# Patient Record
Sex: Male | Born: 1935 | Race: White | Hispanic: No | Marital: Married | Smoking: Former smoker
Health system: Southern US, Community
[De-identification: ages and names within clinical notes are randomized; demographics above are authoritative.]

## PROBLEM LIST (undated history)

## (undated) DIAGNOSIS — H269 Unspecified cataract: Secondary | ICD-10-CM

## (undated) DIAGNOSIS — I429 Cardiomyopathy, unspecified: Secondary | ICD-10-CM

## (undated) DIAGNOSIS — I4892 Unspecified atrial flutter: Secondary | ICD-10-CM

## (undated) DIAGNOSIS — I890 Lymphedema, not elsewhere classified: Secondary | ICD-10-CM

## (undated) DIAGNOSIS — H9190 Unspecified hearing loss, unspecified ear: Secondary | ICD-10-CM

## (undated) DIAGNOSIS — K635 Polyp of colon: Secondary | ICD-10-CM

## (undated) DIAGNOSIS — F0390 Unspecified dementia without behavioral disturbance: Secondary | ICD-10-CM

## (undated) DIAGNOSIS — N200 Calculus of kidney: Secondary | ICD-10-CM

## (undated) DIAGNOSIS — I4891 Unspecified atrial fibrillation: Secondary | ICD-10-CM

## (undated) DIAGNOSIS — C61 Malignant neoplasm of prostate: Secondary | ICD-10-CM

## (undated) DIAGNOSIS — N281 Cyst of kidney, acquired: Secondary | ICD-10-CM

## (undated) DIAGNOSIS — I951 Orthostatic hypotension: Secondary | ICD-10-CM

## (undated) HISTORY — PX: CHOLECYSTECTOMY: SHX55

## (undated) HISTORY — PX: PROSTATECTOMY: SHX69

---

## 2014-08-17 ENCOUNTER — Other Ambulatory Visit: Payer: Self-pay

## 2014-08-17 ENCOUNTER — Emergency Department (HOSPITAL_BASED_OUTPATIENT_CLINIC_OR_DEPARTMENT_OTHER): Payer: Medicare Other

## 2014-08-17 ENCOUNTER — Emergency Department (HOSPITAL_BASED_OUTPATIENT_CLINIC_OR_DEPARTMENT_OTHER)
Admission: EM | Admit: 2014-08-17 | Discharge: 2014-08-17 | Disposition: A | Discharge status: Home / Self Care | Payer: Medicare Other | Attending: Emergency Medicine | Admitting: Emergency Medicine

## 2014-08-17 ENCOUNTER — Encounter (HOSPITAL_BASED_OUTPATIENT_CLINIC_OR_DEPARTMENT_OTHER): Payer: Self-pay | Admitting: *Deleted

## 2014-08-17 DIAGNOSIS — R42 Dizziness and giddiness: Secondary | ICD-10-CM | POA: Insufficient documentation

## 2014-08-17 DIAGNOSIS — Z8546 Personal history of malignant neoplasm of prostate: Secondary | ICD-10-CM | POA: Insufficient documentation

## 2014-08-17 DIAGNOSIS — Z86010 Personal history of colonic polyps: Secondary | ICD-10-CM | POA: Insufficient documentation

## 2014-08-17 DIAGNOSIS — Z8679 Personal history of other diseases of the circulatory system: Secondary | ICD-10-CM | POA: Diagnosis not present

## 2014-08-17 DIAGNOSIS — Z87891 Personal history of nicotine dependence: Secondary | ICD-10-CM | POA: Diagnosis not present

## 2014-08-17 DIAGNOSIS — H9190 Unspecified hearing loss, unspecified ear: Secondary | ICD-10-CM | POA: Insufficient documentation

## 2014-08-17 DIAGNOSIS — Z79899 Other long term (current) drug therapy: Secondary | ICD-10-CM | POA: Diagnosis not present

## 2014-08-17 DIAGNOSIS — Q6100 Congenital renal cyst, unspecified: Secondary | ICD-10-CM | POA: Insufficient documentation

## 2014-08-17 DIAGNOSIS — Z87442 Personal history of urinary calculi: Secondary | ICD-10-CM | POA: Diagnosis not present

## 2014-08-17 DIAGNOSIS — H269 Unspecified cataract: Secondary | ICD-10-CM | POA: Diagnosis not present

## 2014-08-17 HISTORY — DX: Unspecified atrial flutter: I48.92

## 2014-08-17 HISTORY — DX: Orthostatic hypotension: I95.1

## 2014-08-17 HISTORY — DX: Unspecified hearing loss, unspecified ear: H91.90

## 2014-08-17 HISTORY — DX: Unspecified cataract: H26.9

## 2014-08-17 HISTORY — DX: Calculus of kidney: N20.0

## 2014-08-17 HISTORY — DX: Cardiomyopathy, unspecified: I42.9

## 2014-08-17 HISTORY — DX: Unspecified atrial fibrillation: I48.91

## 2014-08-17 HISTORY — DX: Polyp of colon: K63.5

## 2014-08-17 HISTORY — DX: Lymphedema, not elsewhere classified: I89.0

## 2014-08-17 HISTORY — DX: Cyst of kidney, acquired: N28.1

## 2014-08-17 HISTORY — DX: Malignant neoplasm of prostate: C61

## 2014-08-17 LAB — CBC WITH DIFFERENTIAL/PLATELET
BASOS ABS: 0.1 10*3/uL (ref 0.0–0.1)
BASOS PCT: 1 % (ref 0–1)
EOS ABS: 0.2 10*3/uL (ref 0.0–0.7)
EOS PCT: 2 % (ref 0–5)
HEMATOCRIT: 40.5 % (ref 39.0–52.0)
Hemoglobin: 13.9 g/dL (ref 13.0–17.0)
Lymphocytes Relative: 25 % (ref 12–46)
Lymphs Abs: 2.4 10*3/uL (ref 0.7–4.0)
MCH: 31.7 pg (ref 26.0–34.0)
MCHC: 34.3 g/dL (ref 30.0–36.0)
MCV: 92.5 fL (ref 78.0–100.0)
Monocytes Absolute: 1.1 10*3/uL — ABNORMAL HIGH (ref 0.1–1.0)
Monocytes Relative: 12 % (ref 3–12)
NEUTROS ABS: 5.9 10*3/uL (ref 1.7–7.7)
NEUTROS PCT: 60 % (ref 43–77)
PLATELETS: 303 10*3/uL (ref 150–400)
RBC: 4.38 MIL/uL (ref 4.22–5.81)
RDW: 13.7 % (ref 11.5–15.5)
WBC: 9.6 10*3/uL (ref 4.0–10.5)

## 2014-08-17 LAB — URINALYSIS, ROUTINE W REFLEX MICROSCOPIC
Bilirubin Urine: NEGATIVE
Glucose, UA: NEGATIVE mg/dL
HGB URINE DIPSTICK: NEGATIVE
Ketones, ur: NEGATIVE mg/dL
LEUKOCYTES UA: NEGATIVE
NITRITE: NEGATIVE
Protein, ur: NEGATIVE mg/dL
SPECIFIC GRAVITY, URINE: 1.006 (ref 1.005–1.030)
Urobilinogen, UA: 0.2 mg/dL (ref 0.0–1.0)
pH: 6.5 (ref 5.0–8.0)

## 2014-08-17 LAB — BASIC METABOLIC PANEL
ANION GAP: 3 — AB (ref 5–15)
BUN: 13 mg/dL (ref 6–20)
CALCIUM: 8.1 mg/dL — AB (ref 8.9–10.3)
CO2: 25 mmol/L (ref 22–32)
Chloride: 103 mmol/L (ref 101–111)
Creatinine, Ser: 0.9 mg/dL (ref 0.61–1.24)
GFR calc non Af Amer: >60 mL/min (ref >60)
GLUCOSE: 117 mg/dL — AB (ref 70–99)
Potassium: 3.8 mmol/L (ref 3.5–5.1)
Sodium: 131 mmol/L — ABNORMAL LOW (ref 135–145)

## 2014-08-17 LAB — TROPONIN I

## 2014-08-17 NOTE — ED Notes (Signed)
D/c home with spouse- verbalizes understanding to f/u with cardiologist as soon as possible this week

## 2014-08-17 NOTE — ED Provider Notes (Signed)
CSN: [id number]     Arrival date & time 08/17/14  1022 History   First MD Initiated Contact with Patient 08/17/14 1036     Chief Complaint  Patient presents with  . Dizziness     (Consider location/radiation/quality/duration/timing/severity/associated sxs/prior Treatment) HPI  David Shepard is a 81 y.o. male with PMH of age or fibrillation, orthostatic hypotension presenting with 2 weeks of dizziness described as lightheadedness worse with standing. Patient states he's had acute worsening in the last 3-4 days. Patient presented to his cardiologist 2 weeks ago for similar symptoms and they prescribed amiodarone. Patient is no longer taking warfarin but had 4 months ago. Patient also with complaint of intermittent headaches over the past week described as an ache that developed gradually. Bilateral and frontal .They're not associated with visual changes, slurred speech, weakness. No fevers or chills. No nausea or vomiting. Patient denies chest pain or shortness of breath. Patient ambulates with a cane. He states he was evaluated by neurology over a year ago. They're unsure of the workup.   Past Medical History  Diagnosis Date  . Atrial fibrillation   . Atrial flutter   . Prostate cancer   . Cardiomyopathy   . Renal cyst   . Cataract   . Hearing loss   . Orthostatic hypotension   . Lymphedema   . Colon polyp   . Kidney stone    Past Surgical History  Procedure Laterality Date  . Prostatectomy    . Cholecystectomy     No family history on file. History  Substance Use Topics  . Smoking status: Former Research scientist (life sciences)  . Smokeless tobacco: Never Used  . Alcohol Use: No    Review of Systems 10 Systems reviewed and are negative for acute change except as noted in the HPI.    Allergies  Aspirin and Clindamycin/lincomycin  Home Medications   Prior to Admission medications   Medication Sig Start Date End Date Taking? Authorizing Provider  amiodarone (PACERONE) 200 MG tablet Take 200 mg  by mouth daily.   Yes Historical Provider, MD  Apoaequorin (PREVAGEN PO) Take by mouth.   Yes Historical Provider, MD  ascorbic acid (VITAMIN C) 1000 MG tablet Take 1,000 mg by mouth daily.   Yes Historical Provider, MD  Biotin 1 MG CAPS Take 1 tablet by mouth daily.   Yes Historical Provider, MD  calcium carbonate (OS-CAL) 600 MG TABS tablet Take 600 mg by mouth daily.   Yes Historical Provider, MD  carvedilol (COREG) 25 MG tablet Take 25 mg by mouth 2 (two) times daily with a meal.   Yes Historical Provider, MD  folic acid (FOLVITE) 254 MCG tablet Take 400 mcg by mouth daily.   Yes Historical Provider, MD  Magnesium Oxide 250 MG TABS Take 1 tablet by mouth daily.   Yes Historical Provider, MD  Multiple Vitamin (MULTIVITAMIN) capsule Take 1 capsule by mouth daily.   Yes Historical Provider, MD  Omega-3 Fatty Acids (FISH OIL PO) Take 1 capsule by mouth daily.   Yes Historical Provider, MD  SELENIUM PO Take by mouth.   Yes Historical Provider, MD  zinc gluconate 50 MG tablet Take 50 mg by mouth daily.   Yes Historical Provider, MD   BP 151/79 mmHg  Pulse 38  Temp(Src) 97.3 F (36.3 C) (Oral)  Resp 24  Ht 6\' 2"  (1.88 m)  Wt 195 lb (88.451 kg)  BMI 25.03 kg/m2  SpO2 94% Physical Exam  Constitutional: He is oriented to person, place, and time.  He appears well-developed and well-nourished. No distress.  HENT:  Head: Normocephalic and atraumatic.  Mouth/Throat: Oropharynx is clear and moist.  Eyes: Conjunctivae and EOM are normal. Pupils are equal, round, and reactive to light. Right eye exhibits no discharge. Left eye exhibits no discharge.  Neck: Normal range of motion. Neck supple.  No nuchal rigidity  Cardiovascular: Normal rate and regular rhythm.   Pulmonary/Chest: Effort normal and breath sounds normal. No respiratory distress. He has no wheezes.  Abdominal: Soft. Bowel sounds are normal. He exhibits no distension. There is no tenderness.  Neurological: He is alert and oriented to  person, place, and time. No cranial nerve deficit. Coordination normal.  Speech is clear and goal oriented.  Strength 5/5 in upper and lower extremities. Sensation intact. Intact rapid alternating movements, finger to nose, and heel to shin. No pronator drift. Steady gait with cane.  Skin: Skin is warm and dry. He is not diaphoretic.  Nursing note and vitals reviewed.   ED Course  Procedures (including critical care time) Labs Review Labs Reviewed  CBC WITH DIFFERENTIAL/PLATELET - Abnormal; Notable for the following:    Monocytes Absolute 1.1 (*)    All other components within normal limits  BASIC METABOLIC PANEL - Abnormal; Notable for the following:    Sodium 131 (*)    Glucose, Bld 117 (*)    Calcium 8.1 (*)    Anion gap 3 (*)    All other components within normal limits  URINALYSIS, ROUTINE W REFLEX MICROSCOPIC  TROPONIN I    Imaging Review Dg Chest 2 View  08/17/2014   CLINICAL DATA:  81 year old male with dizziness for 2 weeks. Initial encounter.  EXAM: CHEST  2 VIEW  COMPARISON:  None.  FINDINGS: Cardiomegaly. Possible small pleural effusions. No pneumothorax. No pulmonary edema. No other confluent pulmonary opacity. Flowing osteophytes in the thoracic spine. Cholecystectomy clips. Visualized tracheal air column is within normal limits.  IMPRESSION: Cardiomegaly and possible small pleural effusions. No other acute cardiopulmonary abnormality.   Electronically Signed   By: Genevie Ann M.D.   On: 08/17/2014 11:15   Ct Head Wo Contrast  08/17/2014   CLINICAL DATA:  Two week history of dizziness and balance disorder  EXAM: CT HEAD WITHOUT CONTRAST  TECHNIQUE: Contiguous axial images were obtained from the base of the skull through the vertex without intravenous contrast.  COMPARISON:  None.  FINDINGS: There is mild diffuse atrophy. There is no intracranial mass, hemorrhage, extra-axial fluid collection, or midline shift. There is mild patchy small vessel disease in the centra semiovale  bilaterally. No acute infarct apparent. The bony calvarium appears intact. The mastoid air cells are clear.  IMPRESSION: Mild atrophy with mild periventricular small vessel disease. No intracranial mass, hemorrhage, or acute appearing infarct.   Electronically Signed   By: Lowella Grip III M.D.   On: 08/17/2014 11:19     EKG Interpretation   Date/Time:  Monday Aug 17 2014 10:38:03 EDT Ventricular Rate:  95 PR Interval:    QRS Duration: 166 QT Interval:  418 QTC Calculation: 525 R Axis:   -83 Text Interpretation:  Atrial fibrillation Left axis deviation Left bundle  branch block Abnormal ECG Confirmed by ZAVITZ  MD, JOSHUA ([id number]) on  08/17/2014 10:44:27 AM      MDM   Final diagnoses:  Lightheadedness   Patient presenting with dizziness described as lightheadedness for 2 weeks with recent addition of amiodarone. VSS. Rate controlled and patient in atrial fibrillation. He denies chest pain. Neurological exam  without deficits. Patient ambulatory with steady gait with cane. Laboratory workup reassuring. Head CT without acute abnormalities. Chest x-ray without pneumonia. Urine without evidence of infection. EKG with atrial fibrillation and left bundle branch block without prior to compare to. Negative troponin. No reported chest pain or shortness of breath. Patient is orthostatic. Patient's lightheadedness could be due to orthostatic hypotension which he has a history of or amiodarone. Patient to follow closely with cardiologist for possible medication alteration. Pt nontoxic nonseptic appearing and stable for outpatient management. Referral to neurology as well.  Discussed return precautions with patient. Discussed all results and patient verbalizes understanding and agrees with plan.  This is a shared patient. This patient was discussed with the physician who saw and evaluated the patient and agrees with the plan.     Al Corpus, PA-C 08/17/14 [place]  Elnora Morrison, MD 08/19/14  [phone]

## 2014-08-17 NOTE — ED Notes (Signed)
Dizzy x 2 weeks with recent change inmed for afib- states dizziness worse x 3-4 days- c/o headache- denies chest pain

## 2014-08-17 NOTE — ED Notes (Signed)
Patient transported to CT 

## 2014-08-17 NOTE — Discharge Instructions (Signed)
Return to the emergency room with worsening of symptoms, new symptoms or with symptoms that are concerning , especially severe worsening of headache, visual or speech changes, weakness in face, arms or legs. Please call your doctor/cardiologist for a followup appointment within 24-48 hours. When you talk to your doctor please let them know that you were seen in the emergency department and have them acquire all of your records so that they can discuss the findings with you and formulate a treatment plan to fully care for your new and ongoing problems.  Read below information and follow recommendations.  Dizziness Dizziness is a common problem. It is a feeling of unsteadiness or light-headedness. You may feel like you are about to faint. Dizziness can lead to injury if you stumble or fall. A person of any age group can suffer from dizziness, but dizziness is more common in older adults. CAUSES  Dizziness can be caused by many different things, including:  Middle ear problems.  Standing for too long.  Infections.  An allergic reaction.  Aging.  An emotional response to something, such as the sight of blood.  Side effects of medicines.  Tiredness.  Problems with circulation or blood pressure.  Excessive use of alcohol or medicines, or illegal drug use.  Breathing too fast (hyperventilation).  An irregular heart rhythm (arrhythmia).  A low red blood cell count (anemia).  Pregnancy.  Vomiting, diarrhea, fever, or other illnesses that cause body fluid loss (dehydration).  Diseases or conditions such as Parkinson's disease, high blood pressure (hypertension), diabetes, and thyroid problems.  Exposure to extreme heat. DIAGNOSIS  Your health care provider will ask about your symptoms, perform a physical exam, and perform an electrocardiogram (ECG) to record the electrical activity of your heart. Your health care provider may also perform other heart or blood tests to determine the  cause of your dizziness. These may include:  Transthoracic echocardiogram (TTE). During echocardiography, sound waves are used to evaluate how blood flows through your heart.  Transesophageal echocardiogram (TEE).  Cardiac monitoring. This allows your health care provider to monitor your heart rate and rhythm in real time.  Holter monitor. This is a portable device that records your heartbeat and can help diagnose heart arrhythmias. It allows your health care provider to track your heart activity for several days if needed.  Stress tests by exercise or by giving medicine that makes the heart beat faster. TREATMENT  Treatment of dizziness depends on the cause of your symptoms and can vary greatly. HOME CARE INSTRUCTIONS   Drink enough fluids to keep your urine clear or pale yellow. This is especially important in very hot weather. In older adults, it is also important in cold weather.  Take your medicine exactly as directed if your dizziness is caused by medicines. When taking blood pressure medicines, it is especially important to get up slowly.  Rise slowly from chairs and steady yourself until you feel okay.  In the morning, first sit up on the side of the bed. When you feel okay, stand slowly while holding onto something until you know your balance is fine.  Move your legs often if you need to stand in one place for a long time. Tighten and relax your muscles in your legs while standing.  Have someone stay with you for 1-2 days if dizziness continues to be a problem. Do this until you feel you are well enough to stay alone. Have the person call your health care provider if he or she notices  changes in you that are concerning.  Do not drive or use heavy machinery if you feel dizzy.  Do not drink alcohol. SEEK IMMEDIATE MEDICAL CARE IF:   Your dizziness or light-headedness gets worse.  You feel nauseous or vomit.  You have problems talking, walking, or using your arms, hands, or  legs.  You feel weak.  You are not thinking clearly or you have trouble forming sentences. It may take a friend or family member to notice this.  You have chest pain, abdominal pain, shortness of breath, or sweating.  Your vision changes.  You notice any bleeding.  You have side effects from medicine that seems to be getting worse rather than better. MAKE SURE YOU:   Understand these instructions.  Will watch your condition.  Will get help right away if you are not doing well or get worse. Document Released: 09/27/2000 Document Revised: 04/08/2013 Document Reviewed: 10/21/2010 [facility] Patient Information 2015 [place], [place]. This information is not intended to replace advice given to you by your health care provider. Make sure you discuss any questions you have with your health care provider.   EXAM: CT HEAD WITHOUT CONTRAST  TECHNIQUE: Contiguous axial images were obtained from the base of the skull through the vertex without intravenous contrast.  COMPARISON: None.  FINDINGS: There is mild diffuse atrophy. There is no intracranial mass, hemorrhage, extra-axial fluid collection, or midline shift. There is mild patchy small vessel disease in the centra semiovale bilaterally. No acute infarct apparent. The bony calvarium appears intact. The mastoid air cells are clear.  IMPRESSION: Mild atrophy with mild periventricular small vessel disease. No intracranial mass, hemorrhage, or acute appearing infarct.   Electronically Signed By: Lowella Grip III M.D. On: 08/17/2014 11:19          DG Chest 2 View (Final result) Result time: 08/17/14 11:15:45   Final result by Rad Results In Interface (08/17/14 11:15:45)   Narrative:   CLINICAL DATA: 81 year old male with dizziness for 2 weeks. Initial encounter.  EXAM: CHEST 2 VIEW  COMPARISON: None.  FINDINGS: Cardiomegaly. Possible small pleural effusions. No pneumothorax. No pulmonary edema. No  other confluent pulmonary opacity. Flowing osteophytes in the thoracic spine. Cholecystectomy clips. Visualized tracheal air column is within normal limits.  IMPRESSION: Cardiomegaly and possible small pleural effusions. No other acute cardiopulmonary abnormality.   Negative Troponin I 08/17/2014

## 2014-08-17 NOTE — ED Notes (Signed)
Patient transported to X-ray 

## 2018-12-08 ENCOUNTER — Encounter (HOSPITAL_BASED_OUTPATIENT_CLINIC_OR_DEPARTMENT_OTHER): Payer: Self-pay | Admitting: Emergency Medicine

## 2018-12-08 ENCOUNTER — Emergency Department (HOSPITAL_BASED_OUTPATIENT_CLINIC_OR_DEPARTMENT_OTHER)
Admission: EM | Admit: 2018-12-08 | Discharge: 2018-12-08 | Disposition: A | Discharge status: Home / Self Care | Payer: Medicare Other | Attending: Emergency Medicine | Admitting: Emergency Medicine

## 2018-12-08 ENCOUNTER — Other Ambulatory Visit: Payer: Self-pay

## 2018-12-08 ENCOUNTER — Emergency Department (HOSPITAL_BASED_OUTPATIENT_CLINIC_OR_DEPARTMENT_OTHER): Payer: Medicare Other

## 2018-12-08 DIAGNOSIS — M546 Pain in thoracic spine: Secondary | ICD-10-CM | POA: Insufficient documentation

## 2018-12-08 DIAGNOSIS — W19XXXA Unspecified fall, initial encounter: Secondary | ICD-10-CM

## 2018-12-08 DIAGNOSIS — W010XXA Fall on same level from slipping, tripping and stumbling without subsequent striking against object, initial encounter: Secondary | ICD-10-CM | POA: Insufficient documentation

## 2018-12-08 DIAGNOSIS — Z7901 Long term (current) use of anticoagulants: Secondary | ICD-10-CM | POA: Diagnosis not present

## 2018-12-08 DIAGNOSIS — Z87891 Personal history of nicotine dependence: Secondary | ICD-10-CM | POA: Diagnosis not present

## 2018-12-08 DIAGNOSIS — Z79899 Other long term (current) drug therapy: Secondary | ICD-10-CM | POA: Diagnosis not present

## 2018-12-08 DIAGNOSIS — Z8546 Personal history of malignant neoplasm of prostate: Secondary | ICD-10-CM | POA: Insufficient documentation

## 2018-12-08 LAB — CBC WITH DIFFERENTIAL/PLATELET
Abs Immature Granulocytes: 0.04 10*3/uL (ref 0.00–0.07)
Basophils Absolute: 0.1 10*3/uL (ref 0.0–0.1)
Basophils Relative: 1 %
Eosinophils Absolute: 0.1 10*3/uL (ref 0.0–0.5)
Eosinophils Relative: 1 %
HCT: 40.2 % (ref 39.0–52.0)
Hemoglobin: 13 g/dL (ref 13.0–17.0)
Immature Granulocytes: 0 %
Lymphocytes Relative: 49 %
Lymphs Abs: 7.5 10*3/uL — ABNORMAL HIGH (ref 0.7–4.0)
MCH: 31.6 pg (ref 26.0–34.0)
MCHC: 32.3 g/dL (ref 30.0–36.0)
MCV: 97.8 fL (ref 80.0–100.0)
Monocytes Absolute: 1.8 10*3/uL — ABNORMAL HIGH (ref 0.1–1.0)
Monocytes Relative: 12 %
Neutro Abs: 5.7 10*3/uL (ref 1.7–7.7)
Neutrophils Relative %: 37 %
Platelets: 205 10*3/uL (ref 150–400)
RBC: 4.11 MIL/uL — ABNORMAL LOW (ref 4.22–5.81)
RDW: 13.6 % (ref 11.5–15.5)
WBC: 15.2 10*3/uL — ABNORMAL HIGH (ref 4.0–10.5)
nRBC: 0 % (ref 0.0–0.2)

## 2018-12-08 MED ORDER — TRAMADOL HCL 50 MG PO TABS: 50.0000 mg | ORAL_TABLET | Freq: Four times a day (QID) | ORAL | 0 refills | Status: AC | PRN

## 2018-12-08 NOTE — ED Provider Notes (Signed)
[facility] DEPARTMENT Provider Note   CSN: [id number] Arrival date & time: 12/08/18  1727     History   Chief Complaint Chief Complaint  Patient presents with   Fall    HPI David Shepard is a 85 y.o. male.     Patient is a DNR.  Patient brought in by EMS.  Patient was moving furniture when he had a fall.  Patient with complaint of pain to the thoracic back area between the shoulder blades.  No loss of consciousness states he did not hit his head.  Patient is on Eliquis.  No obvious extremity or lower extremity pain.  No low back pain.     Past Medical History:  Diagnosis Date   Atrial fibrillation ([place])    Atrial flutter ([place])    Cardiomyopathy ([facility])    Cataract    Colon polyp    Hearing loss    Kidney stone    Lymphedema    Orthostatic hypotension    Prostate cancer ([place])    Renal cyst     There are no active problems to display for this patient.   Past Surgical History:  Procedure Laterality Date   CHOLECYSTECTOMY     PROSTATECTOMY          Home Medications    Prior to Admission medications   Medication Sig Start Date End Date Taking? Authorizing Provider  apixaban (ELIQUIS) 5 MG TABS tablet Take 5 mg by mouth 2 (two) times daily.   Yes [provider]  amiodarone (PACERONE) 200 MG tablet Take 200 mg by mouth daily.    [provider]  Apoaequorin (PREVAGEN PO) Take by mouth.    [provider]  ascorbic acid (VITAMIN C) 1000 MG tablet Take 1,000 mg by mouth daily.    [provider]  Biotin 1 MG CAPS Take 1 tablet by mouth daily.    [provider]  calcium carbonate (OS-CAL) 600 MG TABS tablet Take 600 mg by mouth daily.    [provider]  carvedilol (COREG) 25 MG tablet Take 25 mg by mouth 2 (two) times daily with a meal.    [provider]  folic acid (FOLVITE) Q000111Q MCG tablet Take 400 mcg by mouth daily.    [provider]  Magnesium Oxide  250 MG TABS Take 1 tablet by mouth daily.    [provider]  Multiple Vitamin (MULTIVITAMIN) capsule Take 1 capsule by mouth daily.    [provider]  Omega-3 Fatty Acids (FISH OIL PO) Take 1 capsule by mouth daily.    [provider]  SELENIUM PO Take by mouth.    [provider]  traMADol (ULTRAM) 50 MG tablet Take 1 tablet (50 mg total) by mouth every 6 (six) hours as needed for moderate pain. 12/08/18   Fredia Sorrow, MD  zinc gluconate 50 MG tablet Take 50 mg by mouth daily.    [provider]    Family History No family history on file.  Social History Social History   Tobacco Use   Smoking status: Former Smoker   Smokeless tobacco: Never Used  Substance Use Topics   Alcohol use: No   Drug use: No     Allergies   Aspirin and Clindamycin/lincomycin   Review of Systems Review of Systems  Constitutional: Negative for chills and fever.  HENT: Negative for congestion, rhinorrhea and sore throat.   Eyes: Negative for visual disturbance.  Respiratory: Negative for cough and  shortness of breath.   Cardiovascular: Negative for chest pain and leg swelling.  Gastrointestinal: Negative for abdominal pain, diarrhea, nausea and vomiting.  Genitourinary: Negative for dysuria.  Musculoskeletal: Positive for back pain. Negative for neck pain.  Skin: Negative for rash.  Neurological: Negative for dizziness, light-headedness and headaches.  Hematological: Bruises/bleeds easily.  Psychiatric/Behavioral: Negative for confusion.     Physical Exam Updated Vital Signs BP (!) 153/85 (BP Location: Right Arm)    Pulse 62    Temp 98.3 F (36.8 C) (Oral)    Resp 18    Ht 1.88 m (6\' 2" )    Wt 81.6 kg    SpO2 100%    BMI 23.11 kg/m   Physical Exam Vitals signs and nursing note reviewed.  Constitutional:      Appearance: Normal appearance. He is well-developed.  HENT:     Head: Normocephalic and atraumatic.  Eyes:     Extraocular  Movements: Extraocular movements intact.     Conjunctiva/sclera: Conjunctivae normal.     Pupils: Pupils are equal, round, and reactive to light.  Neck:     Musculoskeletal: Normal range of motion and neck supple.  Cardiovascular:     Rate and Rhythm: Normal rate and regular rhythm.     Heart sounds: No murmur.  Pulmonary:     Effort: Pulmonary effort is normal. No respiratory distress.     Breath sounds: Normal breath sounds.  Abdominal:     Palpations: Abdomen is soft.     Tenderness: There is no abdominal tenderness.  Musculoskeletal: Normal range of motion.        General: Signs of injury present. No swelling or deformity.     Comments: Tenderness to palpation to thoracic spine area around T6-T8.  No obvious marks.  No bruising.  Lumbar spine nontender to palpation.  Skin:    General: Skin is warm and dry.  Neurological:     General: No focal deficit present.     Mental Status: He is alert. Mental status is at baseline.     Cranial Nerves: No cranial nerve deficit.      ED Treatments / Results  Labs (all labs ordered are listed, but only abnormal results are displayed) Labs Reviewed  CBC WITH DIFFERENTIAL/PLATELET - Abnormal; Notable for the following components:      Result Value   WBC 15.2 (*)    RBC 4.11 (*)    Lymphs Abs 7.5 (*)    Monocytes Absolute 1.8 (*)    All other components within normal limits    EKG None  Radiology Ct Head Wo Contrast  Result Date: 12/08/2018 CLINICAL DATA:  85 year old male status post fall. Pain. EXAM: CT HEAD WITHOUT CONTRAST CT CERVICAL SPINE WITHOUT CONTRAST CT THORACIC SPINE WITHOUT CONTRAST TECHNIQUE: Multidetector CT imaging of the head, cervical and thoracic spine was performed following the standard protocol without intravenous contrast. Multiplanar CT image reconstructions of the cervical and thoracic spine were also generated. COMPARISON:  Head CT without contrast 08/17/2014 FINDINGS: CT HEAD FINDINGS Brain: Cerebral volume  has not significantly changed and remains normal for age. No midline shift, ventriculomegaly, mass effect, evidence of mass lesion, intracranial hemorrhage or evidence of cortically based acute infarction. Mild for age bilateral white matter hypodensity. Vascular: Calcified atherosclerosis at the skull base. No suspicious intracranial vascular hyperdensity. Skull: Stable and intact. Sinuses/Orbits: Visualized paranasal sinuses and mastoids are stable and well pneumatized. Other: No acute orbit or scalp soft tissue finding. CT CERVICAL SPINE FINDINGS Alignment: Preserved cervical  lordosis. Cervicothoracic junction alignment is within normal limits. Bilateral posterior element alignment is within normal limits. Skull base and vertebrae: Visualized skull base is intact. No atlanto-occipital dissociation. Several cervical spine levels appear functionally ankylosed, including C1-C2 (sagittal image 35), C2-C3 (via the right posterior elements), and C5 through C7 (via bulky flowing anterior osteophytes. No superimposed No acute osseous abnormality identified. Soft tissues and spinal canal: No prevertebral fluid or swelling. No visible canal hematoma. There is generalized bilateral cervical lymphadenopathy in the form of abnormally increased and rounded mostly subcentimeter lymph nodes at all visible nodal stations. The largest nodes are 8-11 millimeters short axis. Disc levels: Evidence of Diffuse idiopathic skeletal hyperostosis (DISH). With multilevel ankylosis as stated above. Multilevel degenerative cervical spinal stenosis suspected, likely most pronounced at C3-C4 and C4-C5. CT THORACIC SPINE FINDINGS Segmentation: Appears normal. Alignment: Preserved thoracic kyphosis. No spondylolisthesis. Vertebrae: Flowing osteophytes throughout the thoracic spine similar to that in the cervical spine resulting in intermittent interbody ankylosis. Little to no posterior element ankylosis is associated. No superimposed No acute  osseous abnormality identified. No acute fracture of the visible ribs. Osteopenia. Soft tissues and spinal canal: Left chest pacemaker type device. Mild superior mediastinal lymphadenopathy in association with the neck findings above. Mild cardiomegaly. No pericardial effusion. Vascular patency is not evaluated in the absence of IV contrast. Calcified aortic atherosclerosis. Right axillary lymphadenopathy (series 15, image 49) is evident. Surgically absent gallbladder with grossly negative other visible noncontrast upper abdominal viscera. Major airways are patent. There is respiratory motion and mild dependent pulmonary atelectasis. A few scattered calcified granulomas are suspected. Otherwise negative visible lungs. Posterior paraspinal soft tissues appear within normal limits. Disc levels: Mild for age thoracic spine degeneration aside from the flowing osteophytes stated earlier. IMPRESSION: 1. Generalized lymphadenopathy in the bilateral neck and some of the upper chest. The nodal appearance most resembles Leukemia / Lymphoma. Recommend further evaluation. 2. No acute traumatic injury identified in the head, cervical spine, or thoracic spine. 3. Stable non contrast CT appearance of the brain since 2016. 4. Intermittent cervical and thoracic spine ankylosis due to diffuse idiopathic skeletal hyperostosis (DISH). 5. Multilevel degenerative cervical spinal stenosis suspected. 6. Mild cardiomegaly. Electronically Signed   By: Genevie Ann M.D.   On: 12/08/2018 18:40   Ct Cervical Spine Wo Contrast  Result Date: 12/08/2018 CLINICAL DATA:  85 year old male status post fall. Pain. EXAM: CT HEAD WITHOUT CONTRAST CT CERVICAL SPINE WITHOUT CONTRAST CT THORACIC SPINE WITHOUT CONTRAST TECHNIQUE: Multidetector CT imaging of the head, cervical and thoracic spine was performed following the standard protocol without intravenous contrast. Multiplanar CT image reconstructions of the cervical and thoracic spine were also  generated. COMPARISON:  Head CT without contrast 08/17/2014 FINDINGS: CT HEAD FINDINGS Brain: Cerebral volume has not significantly changed and remains normal for age. No midline shift, ventriculomegaly, mass effect, evidence of mass lesion, intracranial hemorrhage or evidence of cortically based acute infarction. Mild for age bilateral white matter hypodensity. Vascular: Calcified atherosclerosis at the skull base. No suspicious intracranial vascular hyperdensity. Skull: Stable and intact. Sinuses/Orbits: Visualized paranasal sinuses and mastoids are stable and well pneumatized. Other: No acute orbit or scalp soft tissue finding. CT CERVICAL SPINE FINDINGS Alignment: Preserved cervical lordosis. Cervicothoracic junction alignment is within normal limits. Bilateral posterior element alignment is within normal limits. Skull base and vertebrae: Visualized skull base is intact. No atlanto-occipital dissociation. Several cervical spine levels appear functionally ankylosed, including C1-C2 (sagittal image 35), C2-C3 (via the right posterior elements), and C5 through C7 (via bulky  flowing anterior osteophytes. No superimposed No acute osseous abnormality identified. Soft tissues and spinal canal: No prevertebral fluid or swelling. No visible canal hematoma. There is generalized bilateral cervical lymphadenopathy in the form of abnormally increased and rounded mostly subcentimeter lymph nodes at all visible nodal stations. The largest nodes are 8-11 millimeters short axis. Disc levels: Evidence of Diffuse idiopathic skeletal hyperostosis (DISH). With multilevel ankylosis as stated above. Multilevel degenerative cervical spinal stenosis suspected, likely most pronounced at C3-C4 and C4-C5. CT THORACIC SPINE FINDINGS Segmentation: Appears normal. Alignment: Preserved thoracic kyphosis. No spondylolisthesis. Vertebrae: Flowing osteophytes throughout the thoracic spine similar to that in the cervical spine resulting in  intermittent interbody ankylosis. Little to no posterior element ankylosis is associated. No superimposed No acute osseous abnormality identified. No acute fracture of the visible ribs. Osteopenia. Soft tissues and spinal canal: Left chest pacemaker type device. Mild superior mediastinal lymphadenopathy in association with the neck findings above. Mild cardiomegaly. No pericardial effusion. Vascular patency is not evaluated in the absence of IV contrast. Calcified aortic atherosclerosis. Right axillary lymphadenopathy (series 15, image 49) is evident. Surgically absent gallbladder with grossly negative other visible noncontrast upper abdominal viscera. Major airways are patent. There is respiratory motion and mild dependent pulmonary atelectasis. A few scattered calcified granulomas are suspected. Otherwise negative visible lungs. Posterior paraspinal soft tissues appear within normal limits. Disc levels: Mild for age thoracic spine degeneration aside from the flowing osteophytes stated earlier. IMPRESSION: 1. Generalized lymphadenopathy in the bilateral neck and some of the upper chest. The nodal appearance most resembles Leukemia / Lymphoma. Recommend further evaluation. 2. No acute traumatic injury identified in the head, cervical spine, or thoracic spine. 3. Stable non contrast CT appearance of the brain since 2016. 4. Intermittent cervical and thoracic spine ankylosis due to diffuse idiopathic skeletal hyperostosis (DISH). 5. Multilevel degenerative cervical spinal stenosis suspected. 6. Mild cardiomegaly. Electronically Signed   By: Genevie Ann M.D.   On: 12/08/2018 18:40   Ct Thoracic Spine Wo Contrast  Result Date: 12/08/2018 CLINICAL DATA:  85 year old male status post fall. Pain. EXAM: CT HEAD WITHOUT CONTRAST CT CERVICAL SPINE WITHOUT CONTRAST CT THORACIC SPINE WITHOUT CONTRAST TECHNIQUE: Multidetector CT imaging of the head, cervical and thoracic spine was performed following the standard protocol  without intravenous contrast. Multiplanar CT image reconstructions of the cervical and thoracic spine were also generated. COMPARISON:  Head CT without contrast 08/17/2014 FINDINGS: CT HEAD FINDINGS Brain: Cerebral volume has not significantly changed and remains normal for age. No midline shift, ventriculomegaly, mass effect, evidence of mass lesion, intracranial hemorrhage or evidence of cortically based acute infarction. Mild for age bilateral white matter hypodensity. Vascular: Calcified atherosclerosis at the skull base. No suspicious intracranial vascular hyperdensity. Skull: Stable and intact. Sinuses/Orbits: Visualized paranasal sinuses and mastoids are stable and well pneumatized. Other: No acute orbit or scalp soft tissue finding. CT CERVICAL SPINE FINDINGS Alignment: Preserved cervical lordosis. Cervicothoracic junction alignment is within normal limits. Bilateral posterior element alignment is within normal limits. Skull base and vertebrae: Visualized skull base is intact. No atlanto-occipital dissociation. Several cervical spine levels appear functionally ankylosed, including C1-C2 (sagittal image 35), C2-C3 (via the right posterior elements), and C5 through C7 (via bulky flowing anterior osteophytes. No superimposed No acute osseous abnormality identified. Soft tissues and spinal canal: No prevertebral fluid or swelling. No visible canal hematoma. There is generalized bilateral cervical lymphadenopathy in the form of abnormally increased and rounded mostly subcentimeter lymph nodes at all visible nodal stations. The largest nodes are 8-11  millimeters short axis. Disc levels: Evidence of Diffuse idiopathic skeletal hyperostosis (DISH). With multilevel ankylosis as stated above. Multilevel degenerative cervical spinal stenosis suspected, likely most pronounced at C3-C4 and C4-C5. CT THORACIC SPINE FINDINGS Segmentation: Appears normal. Alignment: Preserved thoracic kyphosis. No spondylolisthesis.  Vertebrae: Flowing osteophytes throughout the thoracic spine similar to that in the cervical spine resulting in intermittent interbody ankylosis. Little to no posterior element ankylosis is associated. No superimposed No acute osseous abnormality identified. No acute fracture of the visible ribs. Osteopenia. Soft tissues and spinal canal: Left chest pacemaker type device. Mild superior mediastinal lymphadenopathy in association with the neck findings above. Mild cardiomegaly. No pericardial effusion. Vascular patency is not evaluated in the absence of IV contrast. Calcified aortic atherosclerosis. Right axillary lymphadenopathy (series 15, image 49) is evident. Surgically absent gallbladder with grossly negative other visible noncontrast upper abdominal viscera. Major airways are patent. There is respiratory motion and mild dependent pulmonary atelectasis. A few scattered calcified granulomas are suspected. Otherwise negative visible lungs. Posterior paraspinal soft tissues appear within normal limits. Disc levels: Mild for age thoracic spine degeneration aside from the flowing osteophytes stated earlier. IMPRESSION: 1. Generalized lymphadenopathy in the bilateral neck and some of the upper chest. The nodal appearance most resembles Leukemia / Lymphoma. Recommend further evaluation. 2. No acute traumatic injury identified in the head, cervical spine, or thoracic spine. 3. Stable non contrast CT appearance of the brain since 2016. 4. Intermittent cervical and thoracic spine ankylosis due to diffuse idiopathic skeletal hyperostosis (DISH). 5. Multilevel degenerative cervical spinal stenosis suspected. 6. Mild cardiomegaly. Electronically Signed   By: Genevie Ann M.D.   On: 12/08/2018 18:40    Procedures Procedures (including critical care time)  Medications Ordered in ED Medications - No data to display   Initial Impression / Assessment and Plan / ED Course  I have reviewed the triage vital signs and the  nursing notes.  Pertinent labs & imaging results that were available during my care of the patient were reviewed by me and considered in my medical decision making (see chart for details).        CT head neck and thoracic spine without any acute bony injuries.  CT of the neck raise some concerns for some adenopathy.  Based on this a CBC with differential was obtained.  The white blood cell count was 15,000 but differential without any significant abnormalities.  This also could represent lymphoma.  Will recommend follow-up with primary care doctor.  But no evidence of any leukemia based on today's work-up.  Platelets were normal.  Patient stable for discharge home.  Will treat with a short course of tramadol for the back pain.  In addition CT had had no intracranial injuries.  Final Clinical Impressions(s) / ED Diagnoses   Final diagnoses:  Fall, initial encounter  Acute midline thoracic back pain    ED Discharge Orders         Ordered    traMADol (ULTRAM) 50 MG tablet  Every 6 hours PRN     12/08/18 2013           Fredia Sorrow, MD 12/08/18 2017

## 2018-12-08 NOTE — ED Notes (Signed)
DNR armband placed on patients right arm

## 2018-12-08 NOTE — ED Triage Notes (Signed)
Pt arrived via EMS. Pt was moving furniture today when he fell. C/o pain to L scapula. Denies LOC, did not hit his head. He takes Eliquis.

## 2018-12-08 NOTE — Discharge Instructions (Addendum)
No acute injuries from the fall.  CT head neck and thoracic spine without any acute injuries.  Take the tramadol as needed for pain.  Make an appointment to follow-up with your doctors.  CT scan of the neck did show some enlarged lymph nodes in the neck.  Basic lab here without any evidence of leukemia.  But would recommend follow-up with your doctor regarding the enlarged lymph nodes.  Not related to any injury.

## 2018-12-08 NOTE — ED Notes (Signed)
Patient transported to CT 

## 2018-12-08 NOTE — ED Notes (Signed)
Pt's hearing aids (2)  placed in cup at bedside labeled with his name

## 2018-12-08 NOTE — ED Notes (Addendum)
Pt unable to update pharmacy information or medications at this time.

## 2018-12-08 NOTE — ED Notes (Signed)
Spoke with Mrs [facility], pts spouse per pt request to have patient picked up for discharge. Pts wife informed this RN that granddaughter would be her in approx 15 minutes to pick up patient.

## 2018-12-08 NOTE — ED Notes (Signed)
ED Provider at bedside. 

## 2018-12-08 NOTE — ED Notes (Addendum)
Discharge instructions given to pts granddaughter. Pts family member and pt verbalized understanding of discharge instructions. Pts hearing aids verified by family member in pts belongings bag, given to patient in family members car.

## 2019-02-01 ENCOUNTER — Emergency Department (HOSPITAL_BASED_OUTPATIENT_CLINIC_OR_DEPARTMENT_OTHER)
Admission: EM | Admit: 2019-02-01 | Discharge: 2019-02-02 | Disposition: A | Discharge status: Home / Self Care | Payer: Medicare Other | Attending: Emergency Medicine | Admitting: Emergency Medicine

## 2019-02-01 ENCOUNTER — Emergency Department (HOSPITAL_BASED_OUTPATIENT_CLINIC_OR_DEPARTMENT_OTHER): Payer: Medicare Other

## 2019-02-01 ENCOUNTER — Encounter (HOSPITAL_BASED_OUTPATIENT_CLINIC_OR_DEPARTMENT_OTHER): Payer: Self-pay | Admitting: *Deleted

## 2019-02-01 DIAGNOSIS — Y929 Unspecified place or not applicable: Secondary | ICD-10-CM | POA: Diagnosis not present

## 2019-02-01 DIAGNOSIS — S5012XA Contusion of left forearm, initial encounter: Secondary | ICD-10-CM

## 2019-02-01 DIAGNOSIS — Z87891 Personal history of nicotine dependence: Secondary | ICD-10-CM | POA: Insufficient documentation

## 2019-02-01 DIAGNOSIS — Y939 Activity, unspecified: Secondary | ICD-10-CM | POA: Insufficient documentation

## 2019-02-01 DIAGNOSIS — Z7901 Long term (current) use of anticoagulants: Secondary | ICD-10-CM | POA: Insufficient documentation

## 2019-02-01 DIAGNOSIS — Z79899 Other long term (current) drug therapy: Secondary | ICD-10-CM | POA: Insufficient documentation

## 2019-02-01 DIAGNOSIS — W19XXXA Unspecified fall, initial encounter: Secondary | ICD-10-CM | POA: Diagnosis not present

## 2019-02-01 DIAGNOSIS — F0390 Unspecified dementia without behavioral disturbance: Secondary | ICD-10-CM | POA: Diagnosis not present

## 2019-02-01 DIAGNOSIS — R310 Gross hematuria: Secondary | ICD-10-CM | POA: Diagnosis not present

## 2019-02-01 DIAGNOSIS — Z23 Encounter for immunization: Secondary | ICD-10-CM | POA: Diagnosis not present

## 2019-02-01 DIAGNOSIS — Y999 Unspecified external cause status: Secondary | ICD-10-CM | POA: Diagnosis not present

## 2019-02-01 DIAGNOSIS — I4891 Unspecified atrial fibrillation: Secondary | ICD-10-CM | POA: Diagnosis not present

## 2019-02-01 DIAGNOSIS — S51812A Laceration without foreign body of left forearm, initial encounter: Secondary | ICD-10-CM | POA: Insufficient documentation

## 2019-02-01 DIAGNOSIS — S59912A Unspecified injury of left forearm, initial encounter: Secondary | ICD-10-CM | POA: Diagnosis present

## 2019-02-01 DIAGNOSIS — R296 Repeated falls: Secondary | ICD-10-CM

## 2019-02-01 HISTORY — DX: Unspecified dementia, unspecified severity, without behavioral disturbance, psychotic disturbance, mood disturbance, and anxiety: F03.90

## 2019-02-01 LAB — CBC WITH DIFFERENTIAL/PLATELET
Abs Immature Granulocytes: 0.03 10*3/uL (ref 0.00–0.07)
Basophils Absolute: 0.1 10*3/uL (ref 0.0–0.1)
Basophils Relative: 1 %
Eosinophils Absolute: 0.2 10*3/uL (ref 0.0–0.5)
Eosinophils Relative: 1 %
HCT: 38.4 % — ABNORMAL LOW (ref 39.0–52.0)
Hemoglobin: 12.5 g/dL — ABNORMAL LOW (ref 13.0–17.0)
Immature Granulocytes: 0 %
Lymphocytes Relative: 50 %
Lymphs Abs: 6.8 10*3/uL — ABNORMAL HIGH (ref 0.7–4.0)
MCH: 32 pg (ref 26.0–34.0)
MCHC: 32.6 g/dL (ref 30.0–36.0)
MCV: 98.2 fL (ref 80.0–100.0)
Monocytes Absolute: 2.3 10*3/uL — ABNORMAL HIGH (ref 0.1–1.0)
Monocytes Relative: 17 %
Neutro Abs: 4.3 10*3/uL (ref 1.7–7.7)
Neutrophils Relative %: 31 %
Platelets: 210 10*3/uL (ref 150–400)
RBC: 3.91 MIL/uL — ABNORMAL LOW (ref 4.22–5.81)
RDW: 14.2 % (ref 11.5–15.5)
Smear Review: NORMAL
WBC: 13.7 10*3/uL — ABNORMAL HIGH (ref 4.0–10.5)
nRBC: 0 % (ref 0.0–0.2)

## 2019-02-01 LAB — BASIC METABOLIC PANEL
Anion gap: 7 (ref 5–15)
BUN: 30 mg/dL — ABNORMAL HIGH (ref 8–23)
CO2: 26 mmol/L (ref 22–32)
Calcium: 8.9 mg/dL (ref 8.9–10.3)
Chloride: 100 mmol/L (ref 98–111)
Creatinine, Ser: 0.96 mg/dL (ref 0.61–1.24)
GFR calc Af Amer: >60 mL/min (ref >60)
GFR calc non Af Amer: >60 mL/min (ref >60)
Glucose, Bld: 107 mg/dL — ABNORMAL HIGH (ref 70–99)
Potassium: 4.3 mmol/L (ref 3.5–5.1)
Sodium: 133 mmol/L — ABNORMAL LOW (ref 135–145)

## 2019-02-01 MED ORDER — TETANUS-DIPHTH-ACELL PERTUSSIS 5-2.5-18.5 LF-MCG/0.5 IM SUSP: 0.5000 mL | Freq: Once | INTRAMUSCULAR | Status: DC

## 2019-02-01 MED ORDER — TETANUS-DIPHTH-ACELL PERTUSSIS 5-2.5-18.5 LF-MCG/0.5 IM SUSP
0.5000 mL | Freq: Once | INTRAMUSCULAR | Status: AC
  Administered 2019-02-01: 0.5 mL via INTRAMUSCULAR
  Filled 2019-02-01: qty 0.5

## 2019-02-01 MED ORDER — CEPHALEXIN 250 MG PO CAPS
500.0000 mg | ORAL_CAPSULE | Freq: Once | ORAL | Status: AC
  Administered 2019-02-02: 500 mg via ORAL
  Filled 2019-02-01: qty 2

## 2019-02-01 MED ORDER — CEPHALEXIN 500 MG PO CAPS: 500.0000 mg | ORAL_CAPSULE | Freq: Four times a day (QID) | ORAL | 0 refills | Status: AC

## 2019-02-01 MED ORDER — ACETAMINOPHEN 325 MG PO TABS
650.0000 mg | ORAL_TABLET | Freq: Once | ORAL | Status: AC
  Administered 2019-02-02: 650 mg via ORAL
  Filled 2019-02-01: qty 2

## 2019-02-01 NOTE — ED Notes (Signed)
Patient transported to X-ray 

## 2019-02-01 NOTE — ED Provider Notes (Signed)
MSE was initiated and I personally evaluated the patient and placed orders (if any) at  10:46 PM on February 01, 2019.  The patient appears stable so that the remainder of the MSE may be completed by another provider.  Patient here with fall. Patient demented and is from independent living. Patient fell several days ago and hit L forearm. Now has more swelling and redness. Patient unable to provide details. Wife will be coming soon. There is some cellulitis L proximal forearm. Will order cbc, forearm xrays. Likely will need abx as well    Drenda Freeze, MD 02/01/19 2248

## 2019-02-01 NOTE — ED Notes (Signed)
ED Provider at bedside. 

## 2019-02-01 NOTE — ED Provider Notes (Addendum)
[facility] Provider Note: Georgena Spurling, MD, FACEP  CSN: [id number] MRN: [id number] ARRIVAL: 02/01/19 at 2207 ROOM: [place]  Fall  Level 5 caveat: Dementia HISTORY OF PRESENT ILLNESS  02/01/19 11:34 PM David Shepard is a 85 y.o. male who reportedly fell 6 days ago.  This has not been determined for sure.  His only injury noted is to his left forearm just distal to the elbow.  There is a large hematoma with an overlying skin tear.  He has been given Tylenol twice today for this.  He is able to tell me it is the worst pain he has ever had and he cannot find a comfortable position to move his arm.  Pain is worse with palpation or movement of the left elbow and pain radiates proximally and distally.  He denies injury or pain elsewhere.  Past Medical History:  Diagnosis Date  . Atrial fibrillation ([place])   . Atrial flutter ([place])   . Cardiomyopathy ([place])   . Cataract   . Colon polyp   . Dementia ([place])   . Hearing loss   . Kidney stone   . Lymphedema   . Orthostatic hypotension   . Prostate cancer ([place])   . Renal cyst     Past Surgical History:  Procedure Laterality Date  . CHOLECYSTECTOMY    . PROSTATECTOMY      No family history on file.  Social History   Tobacco Use  . Smoking status: Former Research scientist (life sciences)  . Smokeless tobacco: Never Used  Substance Use Topics  . Alcohol use: No  . Drug use: No    Prior to Admission medications   Medication Sig Start Date End Date Taking? Authorizing Provider  amiodarone (PACERONE) 200 MG tablet Take 200 mg by mouth daily.    [provider]  apixaban (ELIQUIS) 5 MG TABS tablet Take 5 mg by mouth 2 (two) times daily.    [provider]  Apoaequorin (PREVAGEN PO) Take by mouth.    [provider]  ascorbic acid (VITAMIN C) 1000 MG tablet Take 1,000 mg by mouth daily.    [provider]  Biotin 1 MG CAPS Take 1 tablet by mouth daily.    [provider]  calcium  carbonate (OS-CAL) 600 MG TABS tablet Take 600 mg by mouth daily.    [provider]  carvedilol (COREG) 25 MG tablet Take 25 mg by mouth 2 (two) times daily with a meal.    [provider]  cephALEXin (KEFLEX) 500 MG capsule Take 1 capsule (500 mg total) by mouth 4 (four) times daily. 02/01/19   Nalina Yeatman, MD  folic acid (FOLVITE) [id number] MCG tablet Take 400 mcg by mouth daily.    [provider]  Magnesium Oxide 250 MG TABS Take 1 tablet by mouth daily.    [provider]  Multiple Vitamin (MULTIVITAMIN) capsule Take 1 capsule by mouth daily.    [provider]  Omega-3 Fatty Acids (FISH OIL PO) Take 1 capsule by mouth daily.    [provider]  SELENIUM PO Take by mouth.    [provider]  traMADol (ULTRAM) 50 MG tablet Take 1 tablet (50 mg total) by mouth every 6 (six) hours as needed for moderate pain. 12/08/18   Fredia Sorrow, MD  zinc gluconate 50 MG tablet Take 50 mg by mouth daily.    [provider]    Allergies Aspirin and Clindamycin/lincomycin   REVIEW OF SYSTEMS  PHYSICAL EXAMINATION  Initial Vital Signs Blood pressure (!) 150/93, pulse 86, temperature 98.1 F (36.7 C), temperature source Oral, resp. rate 20, SpO2 98 %.  Examination General: Well-developed, well-nourished male in no acute distress; appearance consistent with age of record HENT: normocephalic; atraumatic Eyes: Right pupil round and reactive to light; left pupil irregular; extraocular muscles intact Neck: supple; nontender Heart: Irregular rhythm Lungs: clear to auscultation bilaterally Abdomen: soft; nondistended; nontender; bowel sounds present Extremities: 2+ edema of lower legs; tender mass proximal to left forearm consistent with hematoma and overlying skin tear, ecchymosis extending distally and proximally:       Neurologic: Awake, alert and oriented; motor function intact in all extremities and symmetric; no facial  droop Skin: Warm and dry Psychiatric: Normal mood and affect   RESULTS  Summary of this visit's results, reviewed by myself:   EKG Interpretation  Date/Time:    Ventricular Rate:    PR Interval:    QRS Duration:   QT Interval:    QTC Calculation:   R Axis:     Text Interpretation:        Laboratory Studies: Results for orders placed or performed during the hospital encounter of 02/01/19 (from the past 24 hour(s))  CBC with Differential/Platelet     Status: Abnormal   Collection Time: 02/01/19 10:33 PM  Result Value Ref Range   WBC 13.7 (H) 4.0 - 10.5 K/uL   RBC 3.91 (L) 4.22 - 5.81 MIL/uL   Hemoglobin 12.5 (L) 13.0 - 17.0 g/dL   HCT 38.4 (L) 39.0 - 52.0 %   MCV 98.2 80.0 - 100.0 fL   MCH 32.0 26.0 - 34.0 pg   MCHC 32.6 30.0 - 36.0 g/dL   RDW 14.2 11.5 - 15.5 %   Platelets 210 150 - 400 K/uL   nRBC 0.0 0.0 - 0.2 %   Neutrophils Relative % 31 %   Neutro Abs 4.3 1.7 - 7.7 K/uL   Lymphocytes Relative 50 %   Lymphs Abs 6.8 (H) 0.7 - 4.0 K/uL   Monocytes Relative 17 %   Monocytes Absolute 2.3 (H) 0.1 - 1.0 K/uL   Eosinophils Relative 1 %   Eosinophils Absolute 0.2 0.0 - 0.5 K/uL   Basophils Relative 1 %   Basophils Absolute 0.1 0.0 - 0.1 K/uL   RBC Morphology MORPHOLOGY UNREMARKABLE    Smear Review Normal platelet morphology    Immature Granulocytes 0 %   Abs Immature Granulocytes 0.03 0.00 - 0.07 K/uL   Reactive, Benign Lymphocytes PRESENT   Basic metabolic panel     Status: Abnormal   Collection Time: 02/01/19 10:33 PM  Result Value Ref Range   Sodium 133 (L) 135 - 145 mmol/L   Potassium 4.3 3.5 - 5.1 mmol/L   Chloride 100 98 - 111 mmol/L   CO2 26 22 - 32 mmol/L   Glucose, Bld 107 (H) 70 - 99 mg/dL   BUN 30 (H) 8 - 23 mg/dL   Creatinine, Ser 0.96 0.61 - 1.24 mg/dL   Calcium 8.9 8.9 - 10.3 mg/dL   GFR calc non Af Amer >60 >60 mL/min   GFR calc Af Amer >60 >60 mL/min   Anion gap 7 5 - 15  CK     Status: Abnormal   Collection Time: 02/01/19 10:33 PM  Result  Value Ref Range   Total CK 37 (L) 49 - 397 U/L  Urinalysis, Routine w reflex microscopic     Status: Abnormal   Collection Time: 02/02/19 12:21  AM  Result Value Ref Range   Color, Urine BROWN (A) YELLOW   APPearance CLOUDY (A) CLEAR   Specific Gravity, Urine 1.025 1.005 - 1.030   pH 6.5 5.0 - 8.0   Glucose, UA NEGATIVE NEGATIVE mg/dL   Hgb urine dipstick LARGE (A) NEGATIVE   Bilirubin Urine NEGATIVE NEGATIVE   Ketones, ur NEGATIVE NEGATIVE mg/dL   Protein, ur 30 (A) NEGATIVE mg/dL   Nitrite NEGATIVE NEGATIVE   Leukocytes,Ua TRACE (A) NEGATIVE  Urinalysis, Microscopic (reflex)     Status: Abnormal   Collection Time: 02/02/19 12:21 AM  Result Value Ref Range   RBC / HPF >50 0 - 5 RBC/hpf   WBC, UA 0-5 0 - 5 WBC/hpf   Bacteria, UA FEW (A) NONE SEEN   Squamous Epithelial / LPF 0-5 0 - 5   Imaging Studies: Dg Forearm Left  Result Date: 02/01/2019 CLINICAL DATA:  Fall, laceration. EXAM: LEFT FOREARM - 2 VIEW COMPARISON:  None. FINDINGS: Cortical margins of the radius and ulna are intact. There is no evidence of fracture or other focal bone lesions. Prominent olecranon spur. Soft tissue prominence about the proximal dorsal forearm as well as in the region of the olecranon bursa. Mild diffuse soft tissue edema. No soft tissue air or radiopaque foreign body. IMPRESSION: 1. No fracture of the left forearm. 2. Soft tissue prominence about the proximal dorsal forearm, may represent hematoma in the setting of fall. Mild diffuse soft tissue edema. Electronically Signed   By: Keith Rake M.D.   On: 02/01/2019 23:04    ED COURSE and MDM  Nursing notes and initial vitals signs, including pulse oximetry, reviewed.  Vitals:   02/01/19 2348  BP: (!) 150/93  Pulse: 86  Resp: 20  Temp: 98.1 F (36.7 C)  TempSrc: Oral  SpO2: 98%   We will immobilize the patient's left upper extremity in a sling and place the hematoma and skin tear in a bulky dressing.  We will start him on Keflex for  infection prophylaxis.  It is noted that he is on Eliquis which likely contributed to the hematoma.  12:14 AM At time of discharge the patient's nurse informed me his urine was abnormally dark brown.  His discharge is on hold pending urinalysis and CK to evaluate for rhabdomyolysis or hematuria.  12:37 AM No evidence of rhabdomyolysis.  Patient's urine does show hematuria.  Urine sent for culture.  If he does have a urinary tract infection we are treating with Keflex which should cover most common pathogens.  PROCEDURES    ED DIAGNOSES     ICD-10-CM   1. Unwitnessed fall  R29.6   2. Traumatic hematoma of left forearm, initial encounter  S50.12XA   3. ISTAP type 2 skin tear of left forearm  S51.812A   4. Gross hematuria  R31.0        Shanon Rosser, MD 02/01/19 2358    Shanon Rosser, MD 02/02/19 [phone]

## 2019-02-01 NOTE — ED Triage Notes (Addendum)
Pt arrives via [facility] after a fall that happened last Sunday. Pt has a history of dementia and not able to answer questions at this time. Per EMS report pt's wife states no injury expect to his left arm. On arrival left forearm is swollen  And posterior aspect of left forearm there is a small  skin tear with soft swelling in the middle of the skin tear. Left forearm and elbow with swelling. Took 2 doses of tylenol today per EMS. Td unknown

## 2019-02-02 ENCOUNTER — Telehealth (HOSPITAL_BASED_OUTPATIENT_CLINIC_OR_DEPARTMENT_OTHER): Payer: Self-pay | Admitting: Emergency Medicine

## 2019-02-02 DIAGNOSIS — S5012XA Contusion of left forearm, initial encounter: Secondary | ICD-10-CM | POA: Diagnosis not present

## 2019-02-02 LAB — URINALYSIS, ROUTINE W REFLEX MICROSCOPIC
Bilirubin Urine: NEGATIVE
Glucose, UA: NEGATIVE mg/dL
Ketones, ur: NEGATIVE mg/dL
Nitrite: NEGATIVE
Protein, ur: 30 mg/dL — AB
Specific Gravity, Urine: 1.025 (ref 1.005–1.030)
pH: 6.5 (ref 5.0–8.0)

## 2019-02-02 LAB — URINALYSIS, MICROSCOPIC (REFLEX): RBC / HPF: >50 RBC/hpf (ref 0–5)

## 2019-02-02 LAB — CK: Total CK: 37 U/L — ABNORMAL LOW (ref 49–397)

## 2019-02-02 NOTE — ED Notes (Signed)
This RN spoke with Iverson Alamin (spouse) who states she will send caregiver to pick patient up at present time; discharge instructions and prescription information provided to her via phone; she verbalizes understanding.

## 2019-02-02 NOTE — ED Notes (Addendum)
Assisted pt to car in a wheelchair. Caregiver is transporting pt home. Sharyn Lull, RN provided instructions to pt's wife. Pt's cane and DNR paperwork sent with pt at d/c.

## 2019-02-03 LAB — URINE CULTURE

## 2020-02-09 IMAGING — CT CT THORACIC SPINE WITHOUT CONTRAST
4 of 10 series · 10 of 33 positions shown, 11 images · non-contrast
Comparison: Head CT without contrast 08/17/2014

CLINICAL DATA: [AGE] male status post fall. Pain.

EXAM:
CT HEAD WITHOUT CONTRAST
CT CERVICAL SPINE WITHOUT CONTRAST
CT THORACIC SPINE WITHOUT CONTRAST
TECHNIQUE: Multidetector CT imaging of the head, cervical and thoracic spine
was performed following the standard protocol without intravenous
contrast. Multiplanar CT image reconstructions of the cervical and
thoracic spine were also generated.

[Series 11: sagittals · sagittal · 0.26mm/px · 3 of 72 slices shown]
[im 18/72  bone]
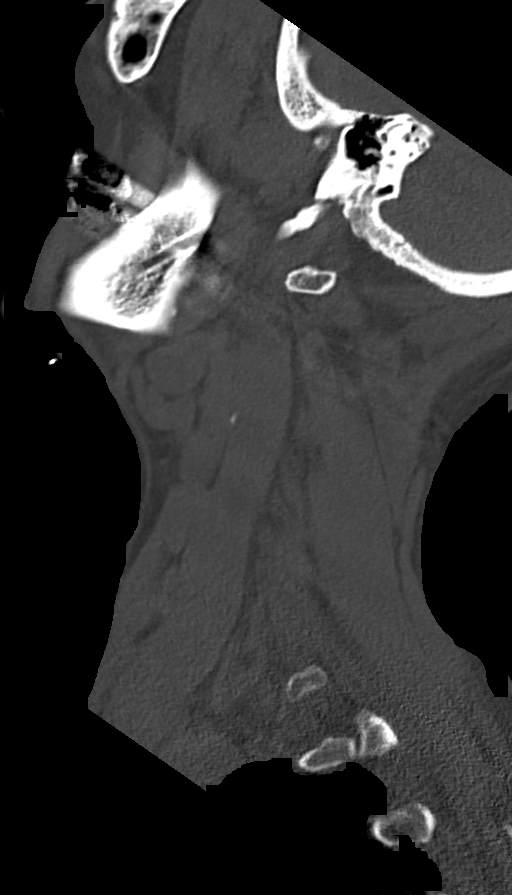
[im 36/72  bone]
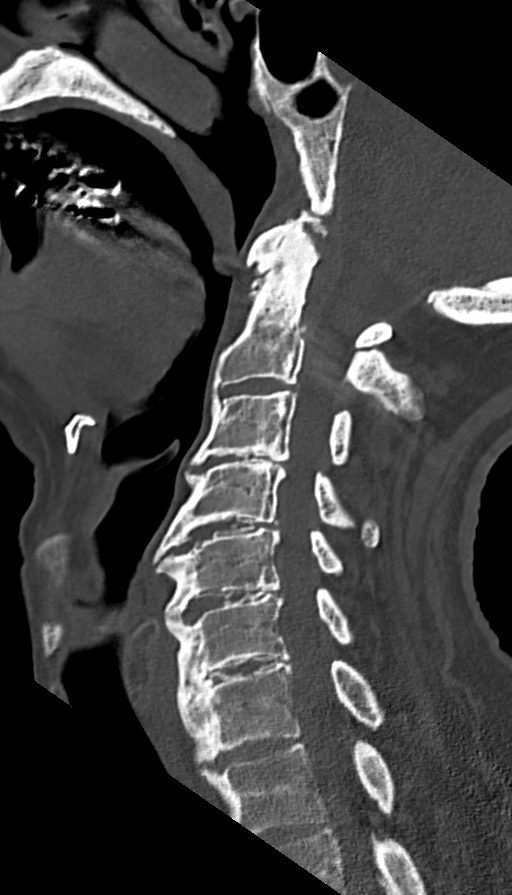
[im 54/72  bone]
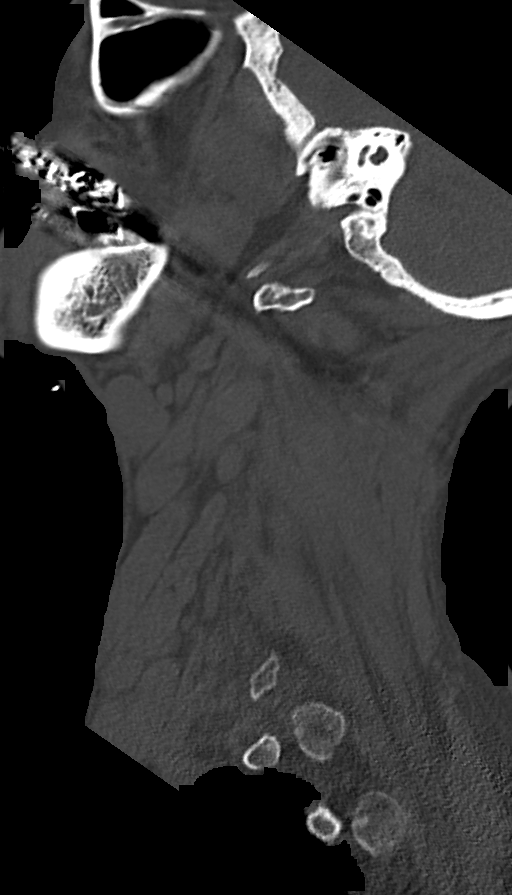

[Series 12: orthogonals · axial · 0.26mm/px · z∈[+712,+768]mm · 2 of 104 slices shown, 3 images]
[im 35/104  soft-tissue]
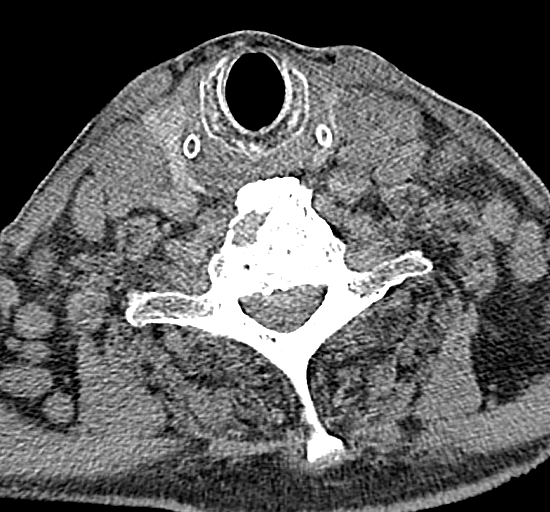
[im 35/104  bone]
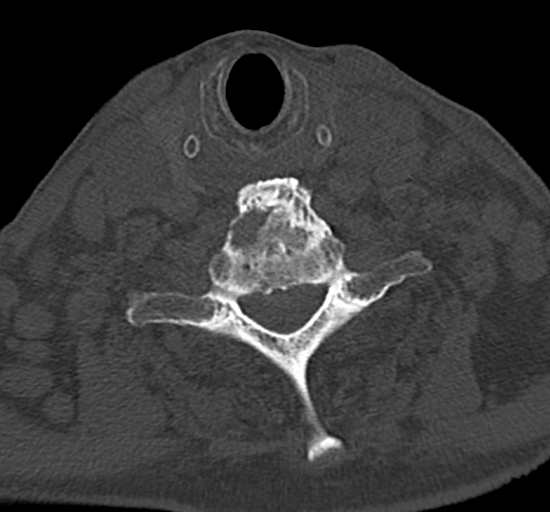
[im 69/104  bone]
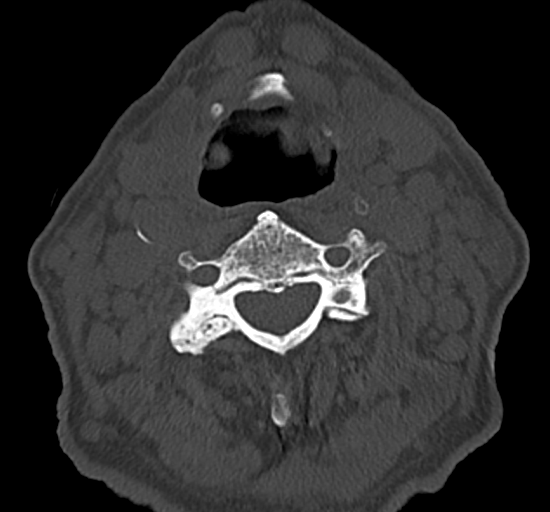

[Series 15: t spine soft · axial · 0.48mm/px · z∈[+519,+713]mm · 4 of 163 slices shown]
[im 33/163  soft-tissue]
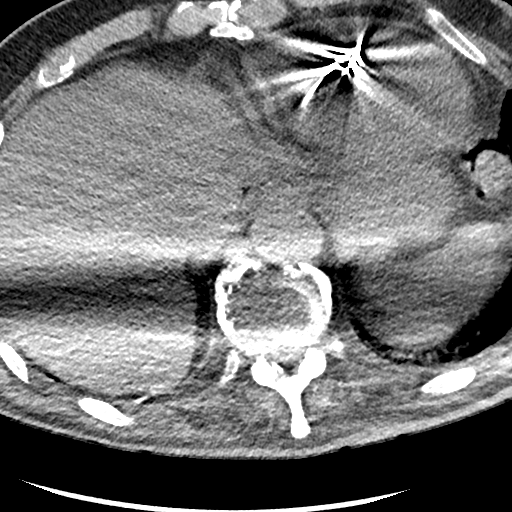
[im 65/163  soft-tissue]
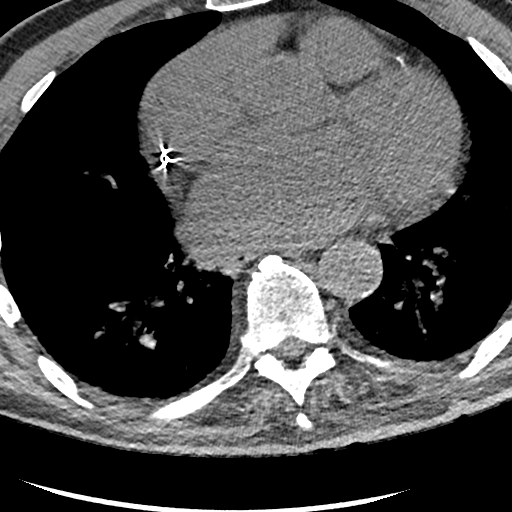
[im 98/163  soft-tissue]
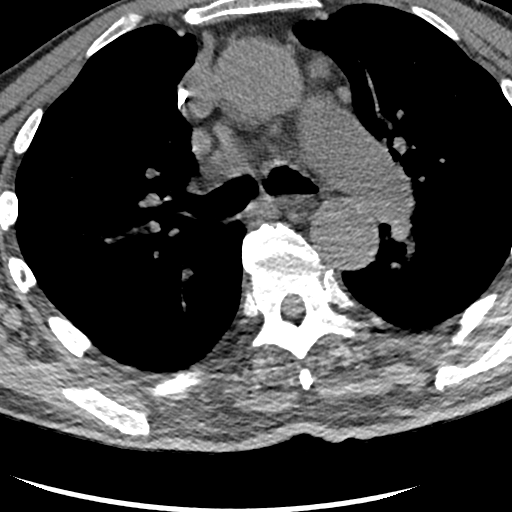
[im 130/163  soft-tissue]
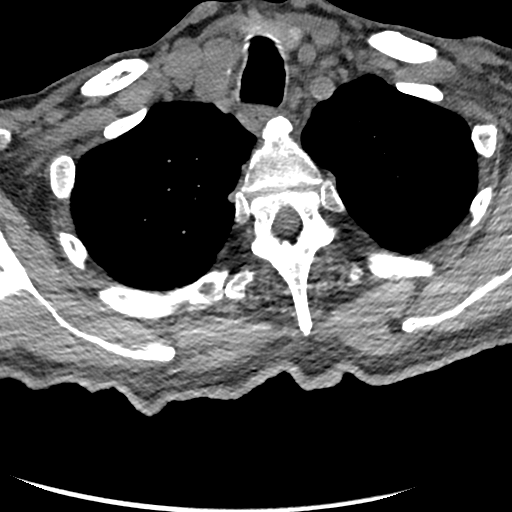

[Series 17: coronal bone · coronal · 0.30mm/px · 1 of 79 slices shown]
[im 40/79  bone]
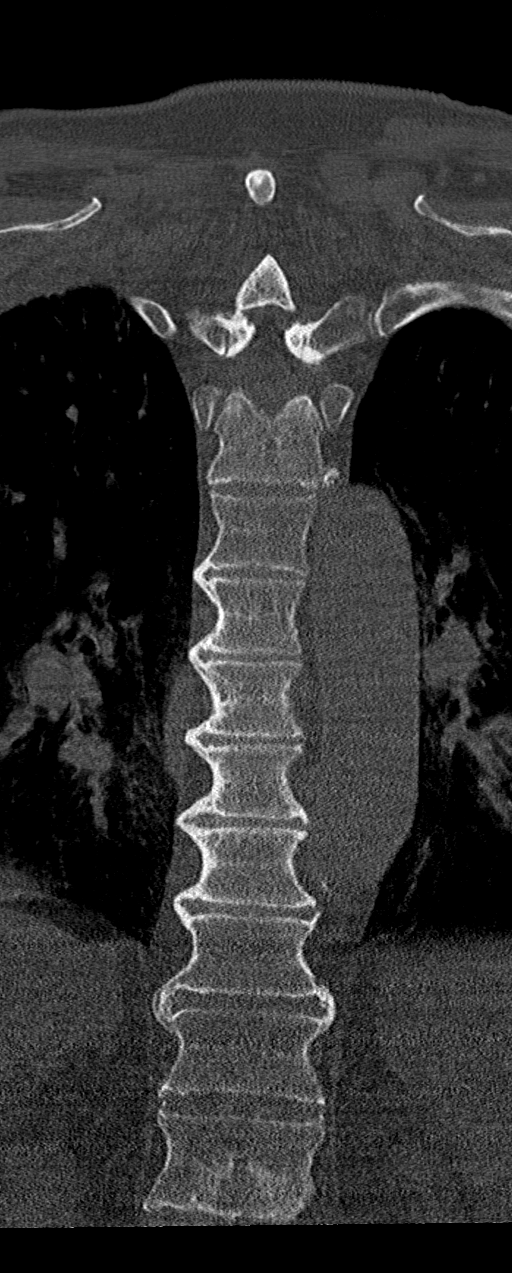

[10 of 33 positions shown; findings below may reference images not displayed]

FINDINGS: CT HEAD FINDINGS

Brain: Cerebral volume has not significantly changed and remains
normal for age.

No midline shift, ventriculomegaly, mass effect, evidence of mass
lesion, intracranial hemorrhage or evidence of cortically based
acute infarction.

Mild for age bilateral white matter hypodensity.

Vascular: Calcified atherosclerosis at the skull base. No suspicious
intracranial vascular hyperdensity.

Skull: Stable and intact.

Sinuses/Orbits: Visualized paranasal sinuses and mastoids are stable
and well pneumatized.

Other: No acute orbit or scalp soft tissue finding.

CT CERVICAL SPINE FINDINGS

Alignment: Preserved cervical lordosis. Cervicothoracic junction
alignment is within normal limits. Bilateral posterior element
alignment is within normal limits.

Skull base and vertebrae: Visualized skull base is intact. No
atlanto-occipital dissociation.

Several cervical spine levels appear functionally ankylosed,
including C1-C2 (sagittal image 35), C2-C3 (via the right posterior
elements), and C5 through C7 (via bulky flowing anterior
osteophytes.

No superimposed No acute osseous abnormality identified.

Soft tissues and spinal canal: No prevertebral fluid or swelling. No
visible canal hematoma.

There is generalized bilateral cervical lymphadenopathy in the form
of abnormally increased and rounded mostly subcentimeter lymph nodes
at all visible nodal stations. The largest nodes are 8-11
millimeters short axis.

Disc levels: Evidence of Diffuse idiopathic skeletal hyperostosis
(DISH). With multilevel ankylosis as stated above. Multilevel
degenerative cervical spinal stenosis suspected, likely most
pronounced at C3-C4 and C4-C5.

CT THORACIC SPINE FINDINGS

Segmentation: Appears normal.

Alignment: Preserved thoracic kyphosis. No spondylolisthesis.

Vertebrae: Flowing osteophytes throughout the thoracic spine similar
to that in the cervical spine resulting in intermittent interbody
ankylosis. Little to no posterior element ankylosis is associated.

No superimposed No acute osseous abnormality identified.

No acute fracture of the visible ribs.

Osteopenia.

Soft tissues and spinal canal: Left chest pacemaker type device.
Mild superior mediastinal lymphadenopathy in association with the
neck findings above. Mild cardiomegaly. No pericardial effusion.
Vascular patency is not evaluated in the absence of IV contrast.
Calcified aortic atherosclerosis.

Right axillary lymphadenopathy (series 15, image 49) is evident.
Surgically absent gallbladder with grossly negative other visible
noncontrast upper abdominal viscera.

Major airways are patent. There is respiratory motion and mild
dependent pulmonary atelectasis. A few scattered calcified
granulomas are suspected. Otherwise negative visible lungs.

Posterior paraspinal soft tissues appear within normal limits.

Disc levels: Mild for age thoracic spine degeneration aside from the
flowing osteophytes stated earlier.
IMPRESSION: 1. Generalized lymphadenopathy in the bilateral neck and some of the
upper chest. The nodal appearance most resembles Leukemia /
Lymphoma.
Recommend further evaluation.
2. No acute traumatic injury identified in the head, cervical spine,
or thoracic spine.
3. Stable non contrast CT appearance of the brain since [DATE]. Intermittent cervical and thoracic spine ankylosis due to diffuse
idiopathic skeletal hyperostosis (DISH).

5. Multilevel degenerative cervical spinal stenosis suspected.
6. Mild cardiomegaly.

## 2020-02-16 DEATH — deceased

## 2020-04-04 IMAGING — DX DG FOREARM 2V*L*
2 series · 2 of 2 positions shown · non-contrast
Comparison: None.

CLINICAL DATA: Fall, laceration.

EXAM:
LEFT FOREARM - 2 VIEW

[forearm ap]
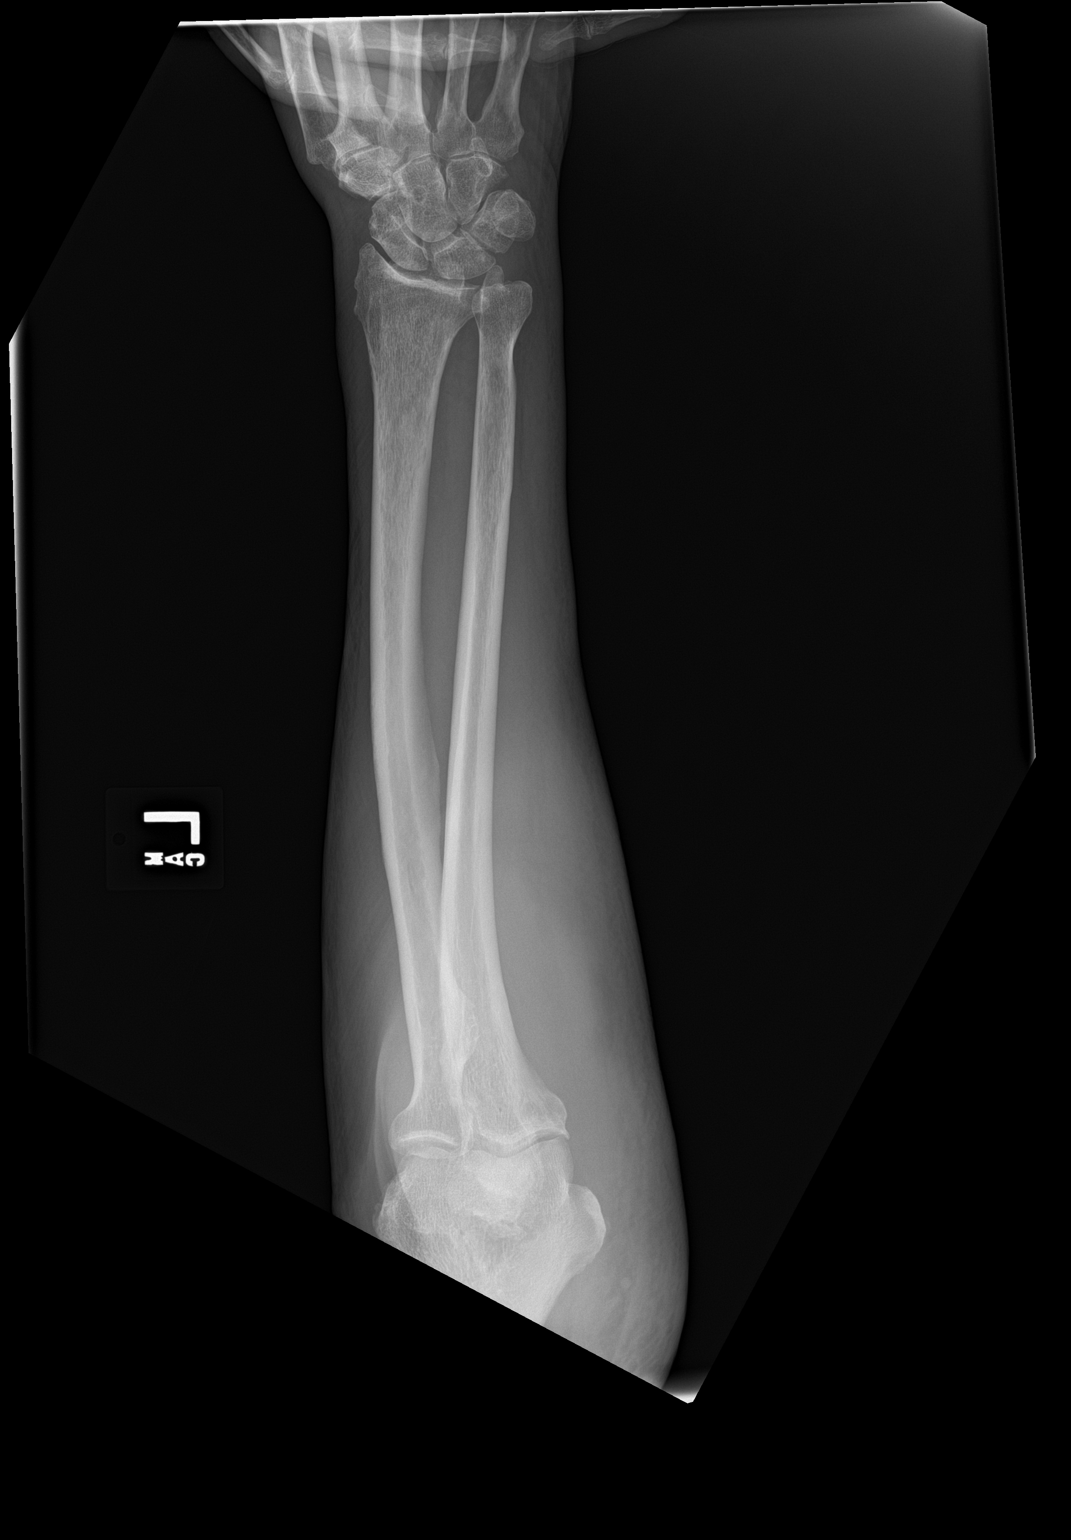

[forearm lat]
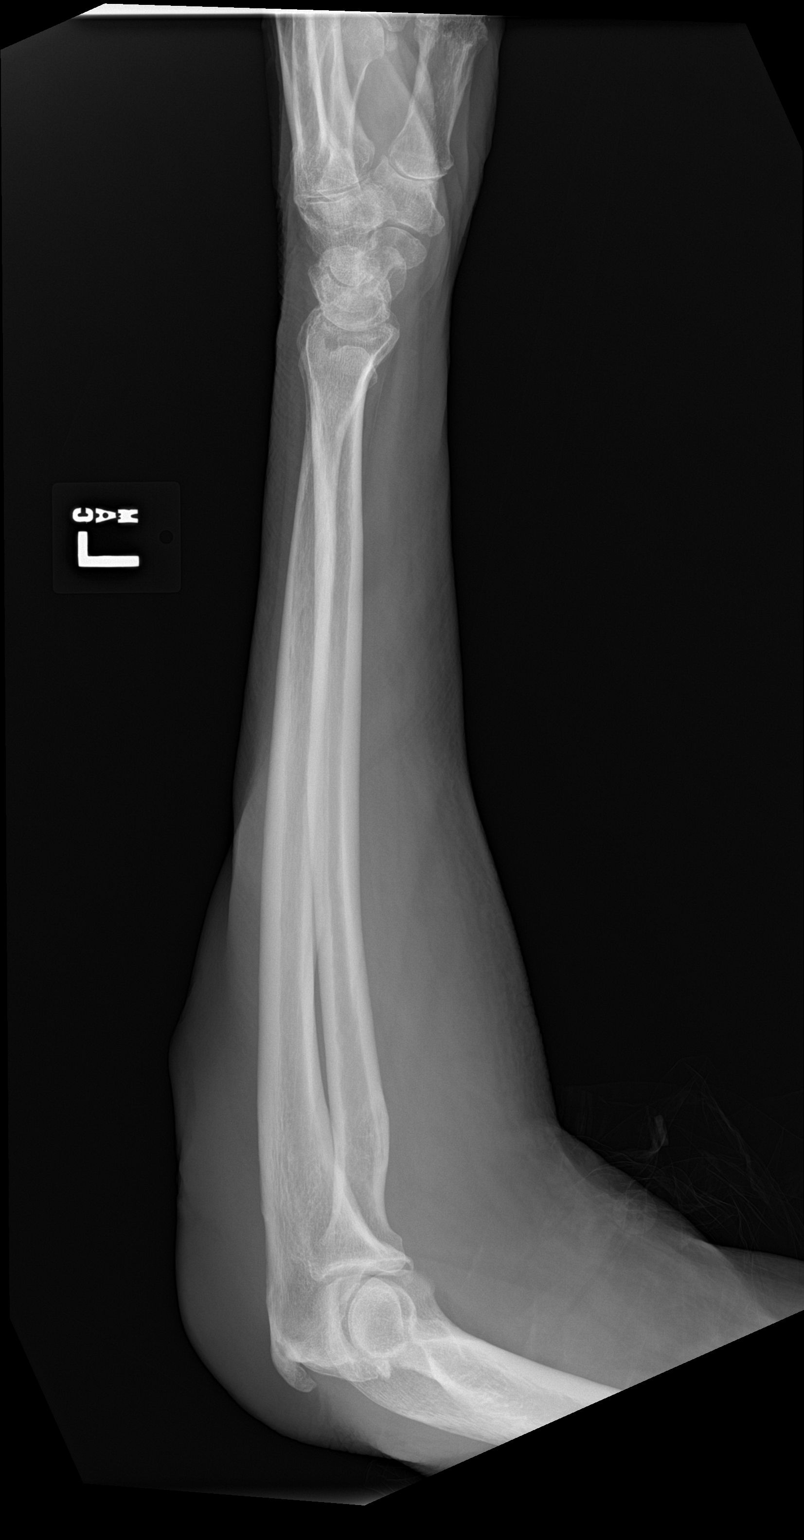

[2 of 2 positions shown; findings below may reference images not displayed]

FINDINGS: Cortical margins of the radius and ulna are intact. There is no
evidence of fracture or other focal bone lesions. Prominent
olecranon spur. Soft tissue prominence about the proximal dorsal
forearm as well as in the region of the olecranon bursa. Mild
diffuse soft tissue edema. No soft tissue air or radiopaque foreign
body.
IMPRESSION: 1. No fracture of the left forearm.
2. Soft tissue prominence about the proximal dorsal forearm, may
represent hematoma in the setting of fall. Mild diffuse soft tissue
edema.
